# Patient Record
Sex: Male | Born: 1990 | Race: White | Hispanic: No | Marital: Single | Smoking: Never smoker
Health system: Southern US, Community
[De-identification: ages and names within clinical notes are randomized; demographics above are authoritative.]

---

## 2016-05-23 ENCOUNTER — Ambulatory Visit
Admission: EM | Admit: 2016-05-23 | Discharge: 2016-05-23 | Disposition: A | Discharge status: Home / Self Care | Payer: Self-pay | Attending: Internal Medicine | Admitting: Internal Medicine

## 2016-05-23 ENCOUNTER — Encounter: Payer: Self-pay | Admitting: *Deleted

## 2016-05-23 ENCOUNTER — Ambulatory Visit (INDEPENDENT_AMBULATORY_CARE_PROVIDER_SITE_OTHER): Payer: Self-pay

## 2016-05-23 DIAGNOSIS — S93602A Unspecified sprain of left foot, initial encounter: Secondary | ICD-10-CM

## 2016-05-23 MED ORDER — OXYCODONE-ACETAMINOPHEN 5-325 MG PO TABS: 2.0000 | ORAL_TABLET | ORAL | 0 refills | Status: DC | PRN

## 2016-05-23 NOTE — ED Triage Notes (Signed)
Patient injured his left ankle when he stepped down off of a set of steps.

## 2016-05-23 NOTE — ED Provider Notes (Signed)
[facility]    CSN: [id number] Arrival date & time: 05/23/16  1906     History   Chief Complaint Chief Complaint  Patient presents with  . Ankle Pain    HPI Alexander Leon is a 26 y.o. male.   Pt c/o of ankle pain after stepping off a stair and misplacing his foot.  Immediately he was unable to bear weight.        History reviewed. No pertinent past medical history.  There are no active problems to display for this patient.   History reviewed. No pertinent surgical history.     Home Medications    Prior to Admission medications   Medication Sig Start Date End Date Taking? Authorizing Provider  oxyCODONE-acetaminophen (PERCOCET/ROXICET) 5-325 MG tablet Take 2 tablets by mouth every 4 (four) hours as needed for severe pain. 05/23/16   Arnaldo NatalMichael S Daveyon Kitchings, MD    Family History History reviewed. No pertinent family history.  Social History Social History  Substance Use Topics  . Smoking status: Never Smoker  . Smokeless tobacco: Never Used  . Alcohol use Yes     Allergies   Patient has no known allergies.   Review of Systems Review of Systems  Constitutional: Negative for chills and fever.  HENT: Negative for sore throat and tinnitus.   Eyes: Negative for redness.  Respiratory: Negative for cough and shortness of breath.   Cardiovascular: Negative for chest pain and palpitations.  Gastrointestinal: Negative for abdominal pain, diarrhea, nausea and vomiting.  Genitourinary: Negative for dysuria, frequency and urgency.  Musculoskeletal: Positive for joint swelling. Negative for myalgias.  Skin: Negative for rash.       No lesions  Neurological: Negative for weakness.  Hematological: Does not bruise/bleed easily.  Psychiatric/Behavioral: Negative for suicidal ideas.     Physical Exam Triage Vital Signs ED Triage Vitals  Enc Vitals Group     BP 05/23/16 1921 (!) 154/77     Pulse Rate 05/23/16 1921 (!) 113     Resp 05/23/16 1921 16       Temp 05/23/16 1921 98.6 F (37 C)     Temp Source 05/23/16 1921 Oral     SpO2 05/23/16 1921 98 %     Weight 05/23/16 1921 250 lb (113.4 kg)     Height 05/23/16 1921 5\' 10"  (1.778 m)     Head Circumference --      Peak Flow --      Pain Score 05/23/16 1923 9     Pain Loc --      Pain Edu? --      Excl. in GC? --    No data found.   Updated Vital Signs BP (!) 154/77 (BP Location: Left Arm)   Pulse (!) 113   Temp 98.6 F (37 C) (Oral)   Resp 16   Ht 5\' 10"  (1.778 m)   Wt 250 lb (113.4 kg)   SpO2 98%   BMI 35.87 kg/m   Visual Acuity Right Eye Distance:   Left Eye Distance:   Bilateral Distance:    Right Eye Near:   Left Eye Near:    Bilateral Near:     Physical Exam  Constitutional: He is oriented to person, place, and time. He appears well-developed and well-nourished. No distress.  HENT:  Head: Normocephalic and atraumatic.  Mouth/Throat: Oropharynx is clear and moist.  Eyes: Conjunctivae and EOM are normal. Pupils are equal, round, and reactive to light. No scleral icterus.  Neck: Normal  range of motion. Neck supple. No JVD present. No tracheal deviation present. No thyromegaly present.  Cardiovascular: Normal rate, regular rhythm and normal heart sounds.  Exam reveals no gallop and no friction rub.   No murmur heard. Pulmonary/Chest: Effort normal and breath sounds normal. No respiratory distress.  Abdominal: Soft. Bowel sounds are normal. He exhibits no distension. There is no tenderness.  Musculoskeletal: Normal range of motion. He exhibits no edema.       Feet:  No point tenderness of ankle although there appears to be some yellow discoloration (?old bruising) anteriorly  Lymphadenopathy:    He has no cervical adenopathy.  Neurological: He is alert and oriented to person, place, and time. No cranial nerve deficit.  Skin: Skin is warm and dry. No rash noted. No erythema.  Psychiatric: He has a normal mood and affect. His behavior is normal. Judgment and  thought content normal.     UC Treatments / Results  Labs (all labs ordered are listed, but only abnormal results are displayed) Labs Reviewed - No data to display  EKG  EKG Interpretation None       Radiology Dg Ankle Complete Left  Result Date: 05/23/2016 CLINICAL DATA:  26 year old male with left foot and ankle injury and pain. EXAM: LEFT FOOT - COMPLETE 3+ VIEW; LEFT ANKLE COMPLETE - 3+ VIEW COMPARISON:  None. FINDINGS: There is no evidence of fracture or dislocation. There is no evidence of arthropathy or other focal bone abnormality. Soft tissues are unremarkable. IMPRESSION: Negative. Electronically Signed   By: Elgie Collard M.D.   On: 05/23/2016 19:46   Dg Foot Complete Left  Result Date: 05/23/2016 CLINICAL DATA:  26 year old male with left foot and ankle injury and pain. EXAM: LEFT FOOT - COMPLETE 3+ VIEW; LEFT ANKLE COMPLETE - 3+ VIEW COMPARISON:  None. FINDINGS: There is no evidence of fracture or dislocation. There is no evidence of arthropathy or other focal bone abnormality. Soft tissues are unremarkable. IMPRESSION: Negative. Electronically Signed   By: Elgie Collard M.D.   On: 05/23/2016 19:46    Procedures Procedures (including critical care time)  Medications Ordered in UC Medications - No data to display   Initial Impression / Assessment and Plan / UC Course  I have reviewed the triage vital signs and the nursing notes.  Pertinent labs & imaging results that were available during my care of the patient were reviewed by me and considered in my medical decision making (see chart for details).     No fracture.  Likely tendon avulsion/sprain. Supply crutches.  F/u with ortho.  Final Clinical Impressions(s) / UC Diagnoses   Final diagnoses:  Sprain of left foot, initial encounter    New Prescriptions Discharge Medication List as of 05/23/2016  8:00 PM    START taking these medications   Details  oxyCODONE-acetaminophen (PERCOCET/ROXICET) 5-325  MG tablet Take 2 tablets by mouth every 4 (four) hours as needed for severe pain., Starting Wed 05/23/2016, Print         Arnaldo Natal, MD 05/23/16 2013

## 2016-06-26 ENCOUNTER — Encounter: Payer: Self-pay | Admitting: Emergency Medicine

## 2016-06-26 ENCOUNTER — Ambulatory Visit
Admission: EM | Admit: 2016-06-26 | Discharge: 2016-06-26 | Disposition: A | Discharge status: Home / Self Care | Payer: Self-pay | Attending: Family Medicine | Admitting: Family Medicine

## 2016-06-26 DIAGNOSIS — R112 Nausea with vomiting, unspecified: Secondary | ICD-10-CM

## 2016-06-26 DIAGNOSIS — R197 Diarrhea, unspecified: Secondary | ICD-10-CM

## 2016-06-26 MED ORDER — ONDANSETRON 8 MG PO TBDP: 8.0000 mg | ORAL_TABLET | Freq: Two times a day (BID) | ORAL | 0 refills | Status: DC

## 2016-06-26 MED ORDER — ONDANSETRON 8 MG PO TBDP
8.0000 mg | ORAL_TABLET | Freq: Once | ORAL | Status: AC
  Administered 2016-06-26: 8 mg via ORAL

## 2016-06-26 NOTE — ED Triage Notes (Signed)
Patient reports N/V/D that started this morning.

## 2016-06-26 NOTE — ED Provider Notes (Signed)
CSN: [id number]     Arrival date & time 06/26/16  1914 History   First MD Initiated Contact with Patient 06/26/16 1940     Chief Complaint  Patient presents with  . Nausea  . Emesis  . Diarrhea   (Consider location/radiation/quality/duration/timing/severity/associated sxs/prior Treatment) HPI  A 26 year old male who presents with a sudden onset of nausea vomiting and diarrhea that started this morning. Numerous movements that have had no blood or diarrhea. They are extremely watery. His had 2 episodes of vomiting but feels very queasy at the present time. He's had been no chills but has had fever of 99.8 tonight pulse rate of 105. Because of the nausea he has not been able to take in fluids adequately. He states that he has been around children that have had the very similar symptoms that he is now having.       History reviewed. No pertinent past medical history. History reviewed. No pertinent surgical history. History reviewed. No pertinent family history. Social History  Substance Use Topics  . Smoking status: Never Smoker  . Smokeless tobacco: Never Used  . Alcohol use Yes    Review of Systems  Constitutional: Positive for activity change and appetite change. Negative for chills, fatigue and fever.  Gastrointestinal: Positive for diarrhea, nausea and vomiting. Negative for abdominal distention and abdominal pain.  All other systems reviewed and are negative.   Allergies  Patient has no known allergies.  Home Medications   Prior to Admission medications   Medication Sig Start Date End Date Taking? Authorizing Provider  ondansetron (ZOFRAN ODT) 8 MG disintegrating tablet Take 1 tablet (8 mg total) by mouth 2 (two) times daily. 06/26/16   Lutricia Feil, PA-C   Meds Ordered and Administered this Visit   Medications  ondansetron (ZOFRAN-ODT) disintegrating tablet 8 mg (8 mg Oral Given 06/26/16 1952)    BP (!) 147/90 (BP Location: Left Arm)   Pulse (!) 105   Temp  99.8 F (37.7 C) (Oral)   Resp 16   Ht  (1.753 m)   Wt 250 lb (113.4 kg)   SpO2 100%   BMI 36.92 kg/m  No data found.   Physical Exam  Constitutional: He is oriented to person, place, and time. He appears well-developed and well-nourished. No distress.  HENT:  Head: Normocephalic and atraumatic.  Right Ear: External ear normal.  Left Ear: External ear normal.  Nose: Nose normal.  Mouth/Throat: Oropharynx is clear and moist. No oropharyngeal exudate.  Eyes: Pupils are equal, round, and reactive to light. Right eye exhibits no discharge. Left eye exhibits no discharge.  Neck: Normal range of motion.  Pulmonary/Chest: Effort normal and breath sounds normal.  Abdominal: Soft. Bowel sounds are normal. He exhibits no distension and no mass. There is no tenderness. There is no rebound and no guarding.  Musculoskeletal: Normal range of motion.  Neurological: He is alert and oriented to person, place, and time.  Skin: Skin is warm and dry. He is not diaphoretic.  Psychiatric: He has a normal mood and affect. His behavior is normal. Judgment and thought content normal.  Nursing note and vitals reviewed.   Urgent Care Course     Procedures (including critical care time)  Labs Review Labs Reviewed - No data to display  Imaging Review No results found.   Visual Acuity Review  Right Eye Distance:   Left Eye Distance:   Bilateral Distance:    Right Eye Near:   Left Eye Near:  Bilateral Near:     Medications  ondansetron (ZOFRAN-ODT) disintegrating tablet 8 mg (8 mg Oral Given 06/26/16 1952)      MDM   1. Nausea vomiting and diarrhea    Discharge Medication List as of 06/26/2016  7:59 PM    START taking these medications   Details  ondansetron (ZOFRAN ODT) 8 MG disintegrating tablet Take 1 tablet (8 mg total) by mouth 2 (two) times daily., Starting Tue 06/26/2016, Normal      Plan: 1. Test/x-ray results and diagnosis reviewed with patient 2. rx as per  orders; risks, benefits, potential side effects reviewed with patient 3. Recommend supportive treatment with increased fluids. Use Zofran for nausea or vomiting. If you are worsening go to the emergency department or follow-up with her primary care physician 4. F/u prn if symptoms worsen or don't improve     Lutricia Feil, PA-C 06/26/16 2030

## 2017-08-10 ENCOUNTER — Other Ambulatory Visit: Payer: Self-pay

## 2017-08-10 ENCOUNTER — Ambulatory Visit
Admission: EM | Admit: 2017-08-10 | Discharge: 2017-08-10 | Disposition: A | Discharge status: Home / Self Care | Payer: Self-pay | Attending: Orthopedic Surgery | Admitting: Orthopedic Surgery

## 2017-08-10 DIAGNOSIS — M79671 Pain in right foot: Secondary | ICD-10-CM

## 2017-08-10 DIAGNOSIS — M25552 Pain in left hip: Secondary | ICD-10-CM

## 2017-08-10 MED ORDER — MELOXICAM 15 MG PO TABS: 15.0000 mg | ORAL_TABLET | Freq: Every day | ORAL | 0 refills | Status: DC

## 2017-08-10 MED ORDER — METHYLPREDNISOLONE 4 MG PO TBPK: ORAL_TABLET | ORAL | 0 refills | Status: DC

## 2017-08-10 NOTE — Discharge Instructions (Signed)
Please take medications as prescribed.  Try to avoid any excessive weightbearing activity.  Rest ice and elevate the right foot.  Perform stretching exercises as directed for the left hip.  Call orthopedic office Monday morning to schedule follow-up appointment.

## 2017-08-10 NOTE — ED Triage Notes (Signed)
Pt with left hip pain and right foot pain x past 3-4 days. Unsure what happened and denies injury. Pain 9/10. 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

## 2017-08-10 NOTE — ED Provider Notes (Signed)
[facility]    CSN: [id number] Arrival date & time: 08/10/17  0801     History   Chief Complaint Chief Complaint  Patient presents with  . Hip Pain  . Foot Pain    HPI Alexander Leon is a 27 y.o. male.   Presents to the urgent care facility for evaluation of nontraumatic right foot pain for 3 to 4 days of nontraumatic left hip pain for 3 to 4 days.  Patient describes pain along the dorsal aspect of his right foot along the medial portion of the foot extending into and under the medial malleolus.  He is able to ambulate, states he has some pain with ambulation is painful resisted ankle plantarflexion and dorsiflexion.  He denies any warmth redness or fevers.  He has taken occasional ibuprofen with mild relief.  Patient states he is been working excessive amounts over the last few days climbing up and down ladders more than normal.  He is also complained of some left hip pain along the lateral aspect of the hip and slightly into the left side of his groin he describes the pain as a aching pain was tightness.  He denies any trauma or injury to the left hip.  Again, he is ambulatory with no assistive devices.  Pain is moderate, mild relief with ibuprofen.  HPI  History reviewed. No pertinent past medical history.  There are no active problems to display for this patient.   History reviewed. No pertinent surgical history.     Home Medications    Prior to Admission medications   Medication Sig Start Date End Date Taking? Authorizing Provider  meloxicam (MOBIC) 15 MG tablet Take 1 tablet (15 mg total) by mouth daily. 08/10/17   Evon SlackGaines, Wei Newbrough C, PA-C  methylPREDNISolone (MEDROL DOSEPAK) 4 MG TBPK tablet Take Dosepak as directed. 08/10/17   Evon SlackGaines, Neola Worrall C, PA-C  ondansetron (ZOFRAN ODT) 8 MG disintegrating tablet Take 1 tablet (8 mg total) by mouth 2 (two) times daily. 06/26/16   Lutricia Feiloemer, William P, PA-C    Family History Family History  Problem Relation Age of Onset  .  CAD Father     Social History Social History   Tobacco Use  . Smoking status: Never Smoker  . Smokeless tobacco: Never Used  Substance Use Topics  . Alcohol use: Yes    Comment: social  . Drug use: No     Allergies   Patient has no known allergies.   Review of Systems Review of Systems  Constitutional: Negative for fever.  Respiratory: Negative for shortness of breath.   Cardiovascular: Negative for chest pain.  Gastrointestinal: Negative for abdominal pain.  Genitourinary: Negative for difficulty urinating, dysuria and urgency.  Musculoskeletal: Positive for arthralgias. Negative for back pain, gait problem, joint swelling and myalgias.  Skin: Negative for rash.  Neurological: Negative for headaches.     Physical Exam Triage Vital Signs ED Triage Vitals  Enc Vitals Group     BP 08/10/17 0815 (!) 141/100     Pulse Rate 08/10/17 0815 94     Resp 08/10/17 0815 18     Temp 08/10/17 0815 98.6 F (37 C)     Temp src --      SpO2 08/10/17 0815 97 %     Weight 08/10/17 0816 260 lb (117.9 kg)     Height 08/10/17 0816 5\' 10"  (1.778 m)     Head Circumference --      Peak Flow --  Pain Score 08/10/17 0816 9     Pain Loc --      Pain Edu? --      Excl. in GC? --    No data found.  Updated Vital Signs BP (!) 141/100 (BP Location: Left Arm)   Pulse 94   Temp 98.6 F (37 C)   Resp 18   Ht 5\' 10"  (1.778 m)   Wt 260 lb (117.9 kg)   SpO2 97%   BMI 37.31 kg/m   Visual Acuity Right Eye Distance:   Left Eye Distance:   Bilateral Distance:    Right Eye Near:   Left Eye Near:    Bilateral Near:     Physical Exam  Constitutional: He is oriented to person, place, and time. He appears well-developed and well-nourished.  HENT:  Head: Normocephalic and atraumatic.  Eyes: Conjunctivae are normal.  Neck: Normal range of motion.  Cardiovascular: Normal rate.  Pulmonary/Chest: Effort normal. No respiratory distress.  Musculoskeletal: Normal range of motion.    Left hip shows full range of motion with internal and external rotation with no discomfort.  Mildly tender along the anterior hip joint, some pain with resisted hip flexion.  Slightly tender over the left hip trochanter bursa.  Is able to ambulate with no significant limp.  Examination of the right foot shows no swelling warmth erythema.  He is very minimally tender to palpation but has pain with resisted ankle plantarflexion dorsiflexion.  Nontender throughout the ankle.  He is nervous intact in right lower extremity.  Neurological: He is alert and oriented to person, place, and time.  Skin: Skin is warm. No rash noted.  Psychiatric: He has a normal mood and affect. His behavior is normal. Thought content normal.     UC Treatments / Results  Labs (all labs ordered are listed, but only abnormal results are displayed) Labs Reviewed - No data to display  EKG None  Radiology No results found.  Procedures Procedures (including critical care time)  Medications Ordered in UC Medications - No data to display  Initial Impression / Assessment and Plan / UC Course  I have reviewed the triage vital signs and the nursing notes.  Pertinent labs & imaging results that were available during my care of the patient were reviewed by me and considered in my medical decision making (see chart for details).    27 year old male with right foot and left hip pain consistent with tendinitis.  No trauma or injury.  Offered x-rays but educated patient that there was a low probability of any type of fracture arthropathy given his age and no trauma.  Patient is without insurance and he agreed that he would prefer no x-rays at this time.  Will start Medrol Dosepak followed by anti-inflammatory medication.  He will call orthopedic office Monday morning and if no improvement follow-up. Final Clinical Impressions(s) / UC Diagnoses   Final diagnoses:  Right foot pain  Left hip pain     Discharge Instructions      Please take medications as prescribed.  Try to avoid any excessive weightbearing activity.  Rest ice and elevate the right foot.  Perform stretching exercises as directed for the left hip.  Call orthopedic office Monday morning to schedule follow-up appointment.   ED Prescriptions    Medication Sig Dispense Auth. Provider   methylPREDNISolone (MEDROL DOSEPAK) 4 MG TBPK tablet Take Dosepak as directed. 21 tablet Evon Slack, PA-C   meloxicam (MOBIC) 15 MG tablet Take 1 tablet (15  mg total) by mouth daily. 30 tablet Willeen Cass, [place] 08/10/17 [phone]

## 2017-12-11 ENCOUNTER — Ambulatory Visit
Admission: EM | Admit: 2017-12-11 | Discharge: 2017-12-11 | Disposition: A | Discharge status: Home / Self Care | Payer: PRIVATE HEALTH INSURANCE | Attending: Family Medicine | Admitting: Family Medicine

## 2017-12-11 ENCOUNTER — Encounter: Payer: Self-pay | Admitting: Emergency Medicine

## 2017-12-11 ENCOUNTER — Other Ambulatory Visit: Payer: Self-pay

## 2017-12-11 DIAGNOSIS — J069 Acute upper respiratory infection, unspecified: Secondary | ICD-10-CM

## 2017-12-11 DIAGNOSIS — R110 Nausea: Secondary | ICD-10-CM | POA: Diagnosis not present

## 2017-12-11 DIAGNOSIS — B9789 Other viral agents as the cause of diseases classified elsewhere: Secondary | ICD-10-CM | POA: Diagnosis not present

## 2017-12-11 LAB — RAPID STREP SCREEN (MED CTR MEBANE ONLY): STREPTOCOCCUS, GROUP A SCREEN (DIRECT): NEGATIVE

## 2017-12-11 MED ORDER — ONDANSETRON 8 MG PO TBDP: 8.0000 mg | ORAL_TABLET | Freq: Three times a day (TID) | ORAL | 0 refills | Status: DC | PRN

## 2017-12-11 NOTE — ED Provider Notes (Signed)
[facility]    CSN: [id number] Arrival date & time: 12/11/17  0806     History   Chief Complaint Chief Complaint  Patient presents with  . Nasal Congestion  . Sore Throat    HPI Alexander Leon is a 27 y.o. male.   The history is provided by the patient.  Sore Throat   URI  Presenting symptoms: congestion, cough, rhinorrhea and sore throat   Severity:  Moderate Onset quality:  Sudden Duration:  3 days Timing:  Constant Progression:  Worsening Chronicity:  New Relieved by:  Nothing Ineffective treatments:  OTC medications Associated symptoms: no sinus pain and no wheezing   Associated symptoms comment:  Nausea Risk factors: sick contacts   Risk factors: not elderly, no chronic cardiac disease, no chronic kidney disease, no chronic respiratory disease, no diabetes mellitus, no immunosuppression, no recent illness and no recent travel     History reviewed. No pertinent past medical history.  There are no active problems to display for this patient.   History reviewed. No pertinent surgical history.     Home Medications    Prior to Admission medications   Medication Sig Start Date End Date Taking? Authorizing Provider  meloxicam (MOBIC) 15 MG tablet Take 1 tablet (15 mg total) by mouth daily. 08/10/17   Evon Slack, PA-C  methylPREDNISolone (MEDROL DOSEPAK) 4 MG TBPK tablet Take Dosepak as directed. 08/10/17   Evon Slack, PA-C  ondansetron (ZOFRAN ODT) 8 MG disintegrating tablet Take 1 tablet (8 mg total) by mouth every 8 (eight) hours as needed. 12/11/17   Payton Mccallum, MD    Family History Family History  Problem Relation Age of Onset  . Diabetes Mother   . CAD Father     Social History Social History   Tobacco Use  . Smoking status: Never Smoker  . Smokeless tobacco: Never Used  Substance Use Topics  . Alcohol use: Yes    Comment: social  . Drug use: No     Allergies   Patient has no known allergies.   Review of  Systems Review of Systems  HENT: Positive for congestion, rhinorrhea and sore throat. Negative for sinus pain.   Respiratory: Positive for cough. Negative for wheezing.      Physical Exam Triage Vital Signs ED Triage Vitals  Enc Vitals Group     BP 12/11/17 0818 139/85     Pulse Rate 12/11/17 0818 82     Resp --      Temp 12/11/17 0818 98.7 F (37.1 C)     Temp Source 12/11/17 0818 Oral     SpO2 12/11/17 0818 98 %     Weight 12/11/17 0817 260 lb (117.9 kg)     Height 12/11/17 0817 5\' 10"  (1.778 m)     Head Circumference --      Peak Flow --      Pain Score 12/11/17 0817 0     Pain Loc --      Pain Edu? --      Excl. in GC? --    No data found.  Updated Vital Signs BP 139/85 (BP Location: Left Arm)   Pulse 82   Temp 98.7 F (37.1 C) (Oral)   Ht 5\' 10"  (1.778 m)   Wt 117.9 kg   SpO2 98%   BMI 37.31 kg/m   Visual Acuity Right Eye Distance:   Left Eye Distance:   Bilateral Distance:    Right Eye Near:   Left  Eye Near:    Bilateral Near:     Physical Exam  Constitutional: He appears well-developed and well-nourished. No distress.  HENT:  Head: Normocephalic and atraumatic.  Right Ear: Tympanic membrane, external ear and ear canal normal.  Left Ear: Tympanic membrane, external ear and ear canal normal.  Nose: Mucosal edema and rhinorrhea present.  Mouth/Throat: Uvula is midline and mucous membranes are normal. Posterior oropharyngeal erythema present. No oropharyngeal exudate, posterior oropharyngeal edema or tonsillar abscesses. No tonsillar exudate.  Eyes: Right eye exhibits no discharge. Left eye exhibits no discharge. No scleral icterus.  Neck: Normal range of motion. Neck supple. No tracheal deviation present. No thyromegaly present.  Cardiovascular: Normal rate, regular rhythm and normal heart sounds.  Pulmonary/Chest: Effort normal and breath sounds normal. No stridor. No respiratory distress. He has no wheezes. He has no rales. He exhibits no tenderness.    Abdominal: Soft. Bowel sounds are normal. He exhibits no distension. There is no tenderness.  Lymphadenopathy:    He has no cervical adenopathy.  Neurological: He is alert.  Skin: Skin is warm and dry. No rash noted. He is not diaphoretic.  Nursing note and vitals reviewed.    UC Treatments / Results  Labs (all labs ordered are listed, but only abnormal results are displayed) Labs Reviewed  RAPID STREP SCREEN ([facility] ONLY)  CULTURE, GROUP A STREP [facility])    EKG None  Radiology No results found.  Procedures Procedures (including critical care time)  Medications Ordered in UC Medications - No data to display  Initial Impression / Assessment and Plan / UC Course  I have reviewed the triage vital signs and the nursing notes.  Pertinent labs & imaging results that were available during my care of the patient were reviewed by me and considered in my medical decision making (see chart for details).      Final Clinical Impressions(s) / UC Diagnoses   Final diagnoses:  Viral URI with cough  Nausea without vomiting    ED Prescriptions    Medication Sig Dispense Auth. Provider   ondansetron (ZOFRAN ODT) 8 MG disintegrating tablet Take 1 tablet (8 mg total) by mouth every 8 (eight) hours as needed. 6 tablet Payton Mccallum, MD      1. Lab results and diagnosis reviewed with patient 2. rx as per orders above; reviewed possible side effects, interactions, risks and benefits  3. Recommend supportive treatment with rest, fluids, otc analgesics prn 4. Follow-up prn if symptoms worsen or don't improve   Controlled Substance Prescriptions [place] Controlled Substance Registry consulted? Not Applicable   Payton Mccallum, MD 12/11/17 [phone]

## 2017-12-11 NOTE — ED Triage Notes (Signed)
Patient in today c/o nasal congestion and sore throat x 2-3 days. Patient denies fever. Patient has tried OTC Nyquil.

## 2017-12-13 LAB — CULTURE, GROUP A STREP (THRC)

## 2018-03-19 IMAGING — CR DG ANKLE COMPLETE 3+V*L*
3 series · 3 of 3 positions shown · non-contrast
Comparison: None.

CLINICAL DATA: 26-year-old male with left foot and ankle injury and
pain.

EXAM:
LEFT FOOT - COMPLETE 3+ VIEW; LEFT ANKLE COMPLETE - 3+ VIEW

[ankle ap]
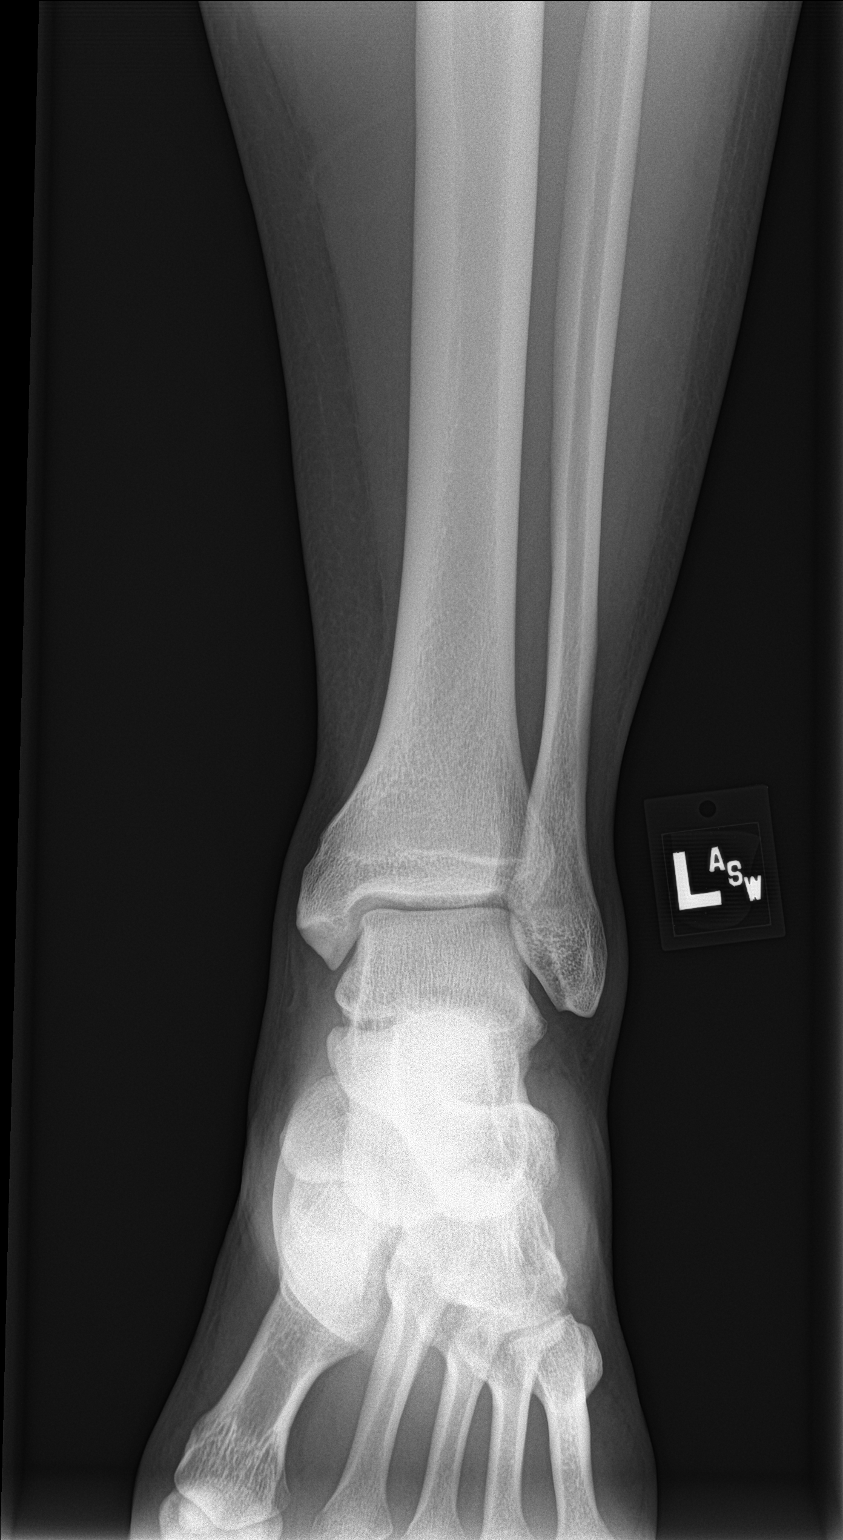

[ankle obl]
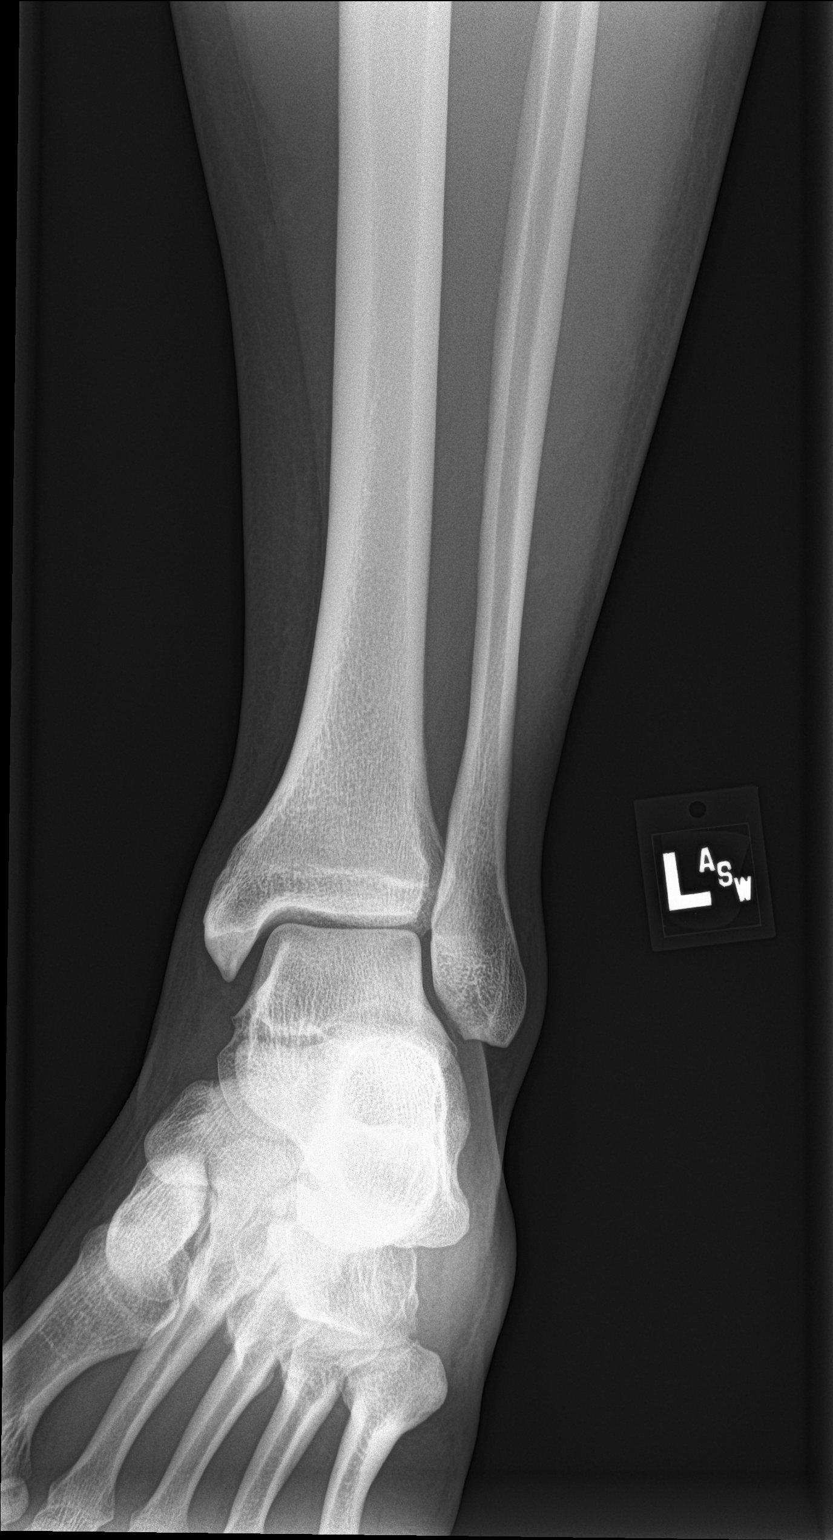

[ankle lat]
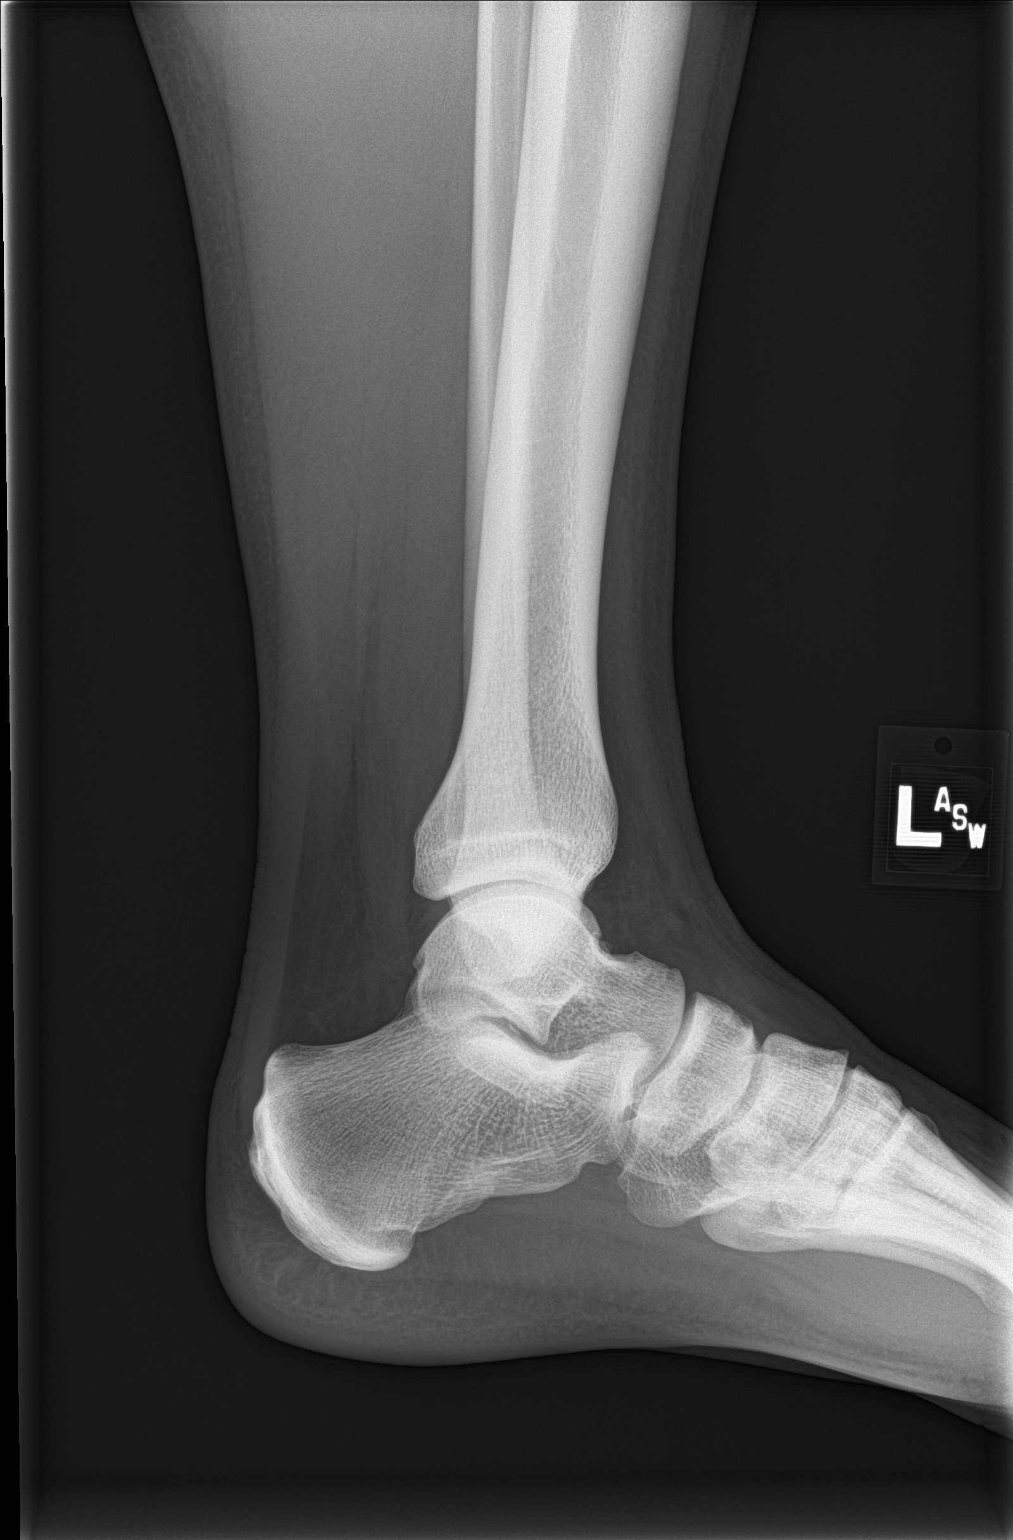

[3 of 3 positions shown; findings below may reference images not displayed]

FINDINGS: There is no evidence of fracture or dislocation. There is no
evidence of arthropathy or other focal bone abnormality. Soft
tissues are unremarkable.
IMPRESSION: Negative.

## 2020-12-27 ENCOUNTER — Ambulatory Visit
Admission: EM | Admit: 2020-12-27 | Discharge: 2020-12-27 | Disposition: A | Discharge status: Home / Self Care | Payer: Self-pay | Attending: Emergency Medicine | Admitting: Emergency Medicine

## 2020-12-27 ENCOUNTER — Ambulatory Visit (INDEPENDENT_AMBULATORY_CARE_PROVIDER_SITE_OTHER): Payer: Self-pay

## 2020-12-27 ENCOUNTER — Other Ambulatory Visit: Payer: Self-pay

## 2020-12-27 DIAGNOSIS — M79671 Pain in right foot: Secondary | ICD-10-CM

## 2020-12-27 DIAGNOSIS — M722 Plantar fascial fibromatosis: Secondary | ICD-10-CM

## 2020-12-27 MED ORDER — PREDNISONE 10 MG (21) PO TBPK: ORAL_TABLET | ORAL | 0 refills | Status: AC

## 2020-12-27 NOTE — Discharge Instructions (Addendum)
Start the prednisone pack according to the package instructions this morning we will get it filled.  He will take it each morning going forward at breakfast time.  Always take it with food.  Wear supportive footwear that has good arch support to take the stress off of your plantar fascia.  In the morning in the evening ice your arch using a frozen water bottle to decrease inflammation and improve pain.  Follow-up with podiatry.  I would recommend contacting [facility] since they have a significant charity care program and they are charger is to take care of the patients of [place].

## 2020-12-27 NOTE — ED Triage Notes (Signed)
Pt c/o chronic right foot pain. Pt was advised it may have been gout and has adjusted his diet to try and help this. Pt states he is still having flares of pain and swelling to the foot. Pt has never been on medication for gout or had labs drawn to establish whether or not it is definitively gout. Pt is concerned that the flares are becoming more frequent. Pt does not have a PCP.

## 2020-12-27 NOTE — ED Provider Notes (Signed)
[facility]    CSN: [id number] Arrival date & time: 12/27/20  0849      History   Chief Complaint Chief Complaint  Patient presents with   Foot Pain    right    HPI Alexander Leon is a 30 y.o. male.   HPI  30 year old male here for evaluation of right foot pain.  Patient reports that he has been experiencing right foot pain for several years since suffering a previous injury.  According to the records he had a foot sprain in March 2018 and was evaluated in this clinic.  He states that since then he has been having intermittent pain that will start in his great toe and extend back to his ankle and cause swelling to his midfoot.  Now he is experiencing pain in his arch that makes it painful to walk and he has been compensating by walking on the lateral edge of his foot is now experiencing pain in the last 2 toes.  The pain is worse with weightbearing and better at rest.  He states that he thought it might be gout flares, after reading up on gout himself, and modified his diet and took anti-inflammatories without any great improvement of symptoms.  Patient was initially referred to orthopedics following his injury in 2018 but he never followed up secondary to not having insurance.  Patient denies any numbness or tingling in his toes and he states that his foot never becomes hot and red when it is painful and swollen.  History reviewed. No pertinent past medical history.  There are no problems to display for this patient.   History reviewed. No pertinent surgical history.     Home Medications    Prior to Admission medications   Medication Sig Start Date End Date Taking? Authorizing Provider  predniSONE (STERAPRED UNI-PAK 21 TAB) 10 MG (21) TBPK tablet Take 6 tablets on day 1, 5 tablets day 2, 4 tablets day 3, 3 tablets day 4, 2 tablets day 5, 1 tablet day 6 12/27/20  Yes Becky Augusta, NP    Family History Family History  Problem Relation Age of Onset    Diabetes Mother    CAD Father     Social History Social History   Tobacco Use   Smoking status: Never   Smokeless tobacco: Never  Vaping Use   Vaping Use: Never used  Substance Use Topics   Alcohol use: Yes    Comment: social   Drug use: No     Allergies   Patient has no known allergies.   Review of Systems Review of Systems  Constitutional:  Negative for activity change, appetite change and fever.  Musculoskeletal:  Positive for arthralgias and joint swelling.  Skin:  Negative for color change.  Neurological:  Negative for weakness and numbness.  Hematological: Negative.   Psychiatric/Behavioral: Negative.      Physical Exam Triage Vital Signs ED Triage Vitals  Enc Vitals Group     BP 12/27/20 0934 (!) 153/102     Pulse Rate 12/27/20 0934 82     Resp 12/27/20 0934 18     Temp 12/27/20 0934 98.7 F (37.1 C)     Temp Source 12/27/20 0934 Oral     SpO2 12/27/20 0934 97 %     Weight 12/27/20 0932 260 lb (117.9 kg)     Height 12/27/20 0932 5\' 9"  (1.753 m)     Head Circumference --      Peak Flow --  Pain Score 12/27/20 0931 8     Pain Loc --      Pain Edu? --      Excl. in GC? --    No data found.  Updated Vital Signs BP (!) 153/102 (BP Location: Left Arm)   Pulse 82   Temp 98.7 F (37.1 C) (Oral)   Resp 18   Ht 5\' 9"  (1.753 m)   Wt 260 lb (117.9 kg)   SpO2 97%   BMI 38.40 kg/m   Visual Acuity Right Eye Distance:   Left Eye Distance:   Bilateral Distance:    Right Eye Near:   Left Eye Near:    Bilateral Near:     Physical Exam Vitals and nursing note reviewed.  Constitutional:      Appearance: Normal appearance. He is obese.  Musculoskeletal:        General: Tenderness present. No swelling or deformity. Normal range of motion.  Skin:    General: Skin is warm and dry.     Capillary Refill: Capillary refill takes less than 2 seconds.     Findings: No bruising, erythema, lesion or rash.  Neurological:     General: No focal deficit  present.     Mental Status: He is alert and oriented to person, place, and time.     Sensory: No sensory deficit.     Motor: No weakness.  Psychiatric:        Mood and Affect: Mood normal.        Behavior: Behavior normal.        Thought Content: Thought content normal.        Judgment: Judgment normal.     UC Treatments / Results  Labs (all labs ordered are listed, but only abnormal results are displayed) Labs Reviewed - No data to display  EKG   Radiology DG Foot Complete Right  Result Date: 12/27/2020 CLINICAL DATA:  30 year old male with arch and distal foot pain. No recent injury. EXAM: RIGHT FOOT COMPLETE - 3+ VIEW COMPARISON:  None. FINDINGS: Bone mineralization is within normal limits. No acute osseous abnormality identified. No discrete soft tissue abnormality. Mild 1st MTP joint space loss and subchondral sclerosis. Subtle similar findings at the 1st IP joint. Other joint spaces and alignment appear normal. But there is chronic degenerative spurring along the dorsal tarsal bones. IMPRESSION: No acute osseous abnormality identified. 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 Electronically Signed   By: 32 M.D.   On: 12/27/2020 10:32    Procedures Procedures (including critical care time)  Medications Ordered in UC Medications - No data to display  Initial Impression / Assessment and Plan / UC Course  I have reviewed the triage vital signs and the nursing notes.  Pertinent labs & imaging results that were available during my care of the patient were reviewed by me and considered in my medical decision making (see chart for details).  Patient is a nontoxic-appearing 30 year old male here for evaluation of right foot pain as outlined in the HPI above.  Physical exam reveals a right foot that is in normal anatomical alignment.  There is no erythema or ecchymosis at present and patient has full range of motion of his foot, ankle, and toes.  DP and PT  pulses are 2+.  Patient does express tenderness with palpation of the third and fourth M TP joints but not with palpation in any of the other joints.  This tenderness does extend up the  metatarsal and along the lateral edge of the foot.  There is no tenderness to the anterior talus joint or with palpation of the medial lateral malleolus.  No Achilles tenderness or calcaneal tenderness noted on exam.  Patient does have tenderness with palpation of the plantar fascia in the arch that extends from midline of the talus to the ball of the foot.  Patient believes that he has been experiencing gout for last 4 years since he suffered an initial injury but states that the area has never become hot and red.  They have also not responded to diet modifications and anti-inflammatories.  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  He did not follow-up as was advised with orthopedics for proper reevaluation and rehabilitation secondary to not having insurance.  I believe the patient has now developed Planter fasciitis as a result of the recurrent swelling he experiences in his foot and his gait abnormality secondary to the pain.  I believe the pain in his lateral 2 toes is also secondary to his current gait modification and walking on the lateral edge of his foot.  We will obtain x-ray of the right foot to look for any evidence of stress fracture or previous injury.  Also look for arthritis.  Plan is to refer patient to podiatry at [facility] as he does not have insurance for further management and rehabilitation.  Right foot radiographs independently reviewed and evaluated by me.  Impression: There is no evidence of acute fracture or dislocation.  In the oblique view there is some questionable irregularity to the proximal and medial aspect of the medial cuneiform.  This is also visible on the lateral.  Radiology overread is pending. Radiology impression is no acute osseous  abnormality identified.  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  We will treat patient with steroid pack and home physical therapy.  I will also refer him to podiatry.   Final Clinical Impressions(s) / UC Diagnoses   Final diagnoses:  Plantar fasciitis of right foot     Discharge Instructions      Start the prednisone pack according to the package instructions this morning we will get it filled.  He will take it each morning going forward at breakfast time.  Always take it with food.  Wear supportive footwear that has good arch support to take the stress off of your plantar fascia.  In the morning in the evening ice your arch using a frozen water bottle to decrease inflammation and improve pain.  Follow-up with podiatry.  I would recommend contacting [facility] since they have a significant charity care program and they are charger is to take care of the patients of [place].     ED Prescriptions     Medication Sig Dispense Auth. Provider   predniSONE (STERAPRED UNI-PAK 21 TAB) 10 MG (21) TBPK tablet Take 6 tablets on day 1, 5 tablets day 2, 4 tablets day 3, 3 tablets day 4, 2 tablets day 5, 1 tablet day 6 21 tablet Becky Augusta, NP      PDMP not reviewed this encounter.   Becky Augusta, NP 12/27/20 1042

## 2021-03-17 ENCOUNTER — Ambulatory Visit
Admission: EM | Admit: 2021-03-17 | Discharge: 2021-03-17 | Disposition: A | Discharge status: Home / Self Care | Payer: BC Managed Care – PPO | Attending: Physician Assistant | Admitting: Physician Assistant

## 2021-03-17 ENCOUNTER — Other Ambulatory Visit: Payer: Self-pay

## 2021-03-17 ENCOUNTER — Encounter: Payer: Self-pay | Admitting: Emergency Medicine

## 2021-03-17 DIAGNOSIS — R051 Acute cough: Secondary | ICD-10-CM | POA: Diagnosis present

## 2021-03-17 DIAGNOSIS — J069 Acute upper respiratory infection, unspecified: Secondary | ICD-10-CM | POA: Insufficient documentation

## 2021-03-17 DIAGNOSIS — Z20822 Contact with and (suspected) exposure to covid-19: Secondary | ICD-10-CM | POA: Diagnosis not present

## 2021-03-17 DIAGNOSIS — R0981 Nasal congestion: Secondary | ICD-10-CM | POA: Diagnosis present

## 2021-03-17 LAB — RESP PANEL BY RT-PCR (FLU A&B, COVID) ARPGX2
Influenza A by PCR: NEGATIVE
Influenza B by PCR: NEGATIVE
SARS Coronavirus 2 by RT PCR: NEGATIVE

## 2021-03-17 MED ORDER — BENZONATATE 100 MG PO CAPS: 100.0000 mg | ORAL_CAPSULE | Freq: Three times a day (TID) | ORAL | 0 refills | Status: AC

## 2021-03-17 MED ORDER — FLUTICASONE PROPIONATE 50 MCG/ACT NA SUSP: 2.0000 | Freq: Every day | NASAL | 0 refills | Status: AC

## 2021-03-17 NOTE — ED Triage Notes (Signed)
Patient c/o cough, chest congestion and nasal congestion that started on Wed.  Patient states he took a rapid covid test on Wed and was negative.  Patient has not checked his temperature.

## 2021-03-17 NOTE — ED Provider Notes (Signed)
[facility]    CSN: [id number] Arrival date & time: 03/17/21  0805      History   Chief Complaint Chief Complaint  Patient presents with   Cough   Nasal Congestion    HPI Alexander Leon is a 31 y.o. male.   HPI  Cold Symptoms: Patient reports that they have had symptoms of dry cough, nasal congestion, chest congestion for the past 2-3 days. Symptoms are stable. They deny SOB, chest pain, fever or vomiting. They have tried nothing yet for symptoms. No known sick contacts. They also do report taking a home COVID-19 test which was negative.   History reviewed. No pertinent past medical history.  There are no problems to display for this patient.   History reviewed. No pertinent surgical history.   Home Medications    Prior to Admission medications   Medication Sig Start Date End Date Taking? Authorizing Provider  predniSONE (STERAPRED UNI-PAK 21 TAB) 10 MG (21) TBPK tablet Take 6 tablets on day 1, 5 tablets day 2, 4 tablets day 3, 3 tablets day 4, 2 tablets day 5, 1 tablet day 6 12/27/20   Becky Augusta, NP    Family History Family History  Problem Relation Age of Onset   Diabetes Mother    CAD Father     Social History Social History   Tobacco Use   Smoking status: Never   Smokeless tobacco: Never  Vaping Use   Vaping Use: Never used  Substance Use Topics   Alcohol use: Yes    Comment: social   Drug use: No     Allergies   Patient has no known allergies.   Review of Systems Review of Systems  As stated above in HPI Physical Exam Triage Vital Signs ED Triage Vitals  Enc Vitals Group     BP 03/17/21 0821 (!) 150/104     Pulse Rate 03/17/21 0821 85     Resp 03/17/21 0821 15     Temp 03/17/21 0821 98.3 F (36.8 C)     Temp Source 03/17/21 0821 Oral     SpO2 03/17/21 0821 97 %     Weight 03/17/21 0818 250 lb (113.4 kg)     Height 03/17/21 0818 5\' 9"  (1.753 m)     Head Circumference --      Peak Flow --      Pain Score 03/17/21  0818 0     Pain Loc --      Pain Edu? --      Excl. in GC? --    No data found.  Updated Vital Signs BP (!) 150/104 (BP Location: Left Arm)    Pulse 85    Temp 98.3 F (36.8 C) (Oral)    Resp 15    Ht 5\' 9"  (1.753 m)    Wt 250 lb (113.4 kg)    SpO2 97%    BMI 36.92 kg/m   Physical Exam Vitals and nursing note reviewed.  Constitutional:      General: He is not in acute distress.    Appearance: He is well-developed.  HENT:     Head: Normocephalic and atraumatic.     Right Ear: Tympanic membrane normal.     Left Ear: Tympanic membrane normal.     Nose: Congestion and rhinorrhea present.     Mouth/Throat:     Mouth: Mucous membranes are moist.     Pharynx: Oropharynx is clear. No oropharyngeal exudate or posterior oropharyngeal erythema.  Eyes:  Conjunctiva/sclera: Conjunctivae normal.  Cardiovascular:     Rate and Rhythm: Regular rhythm. Tachycardia present.     Heart sounds: No murmur heard. Pulmonary:     Effort: Pulmonary effort is normal. No respiratory distress.     Breath sounds: Normal breath sounds.  Abdominal:     Palpations: Abdomen is soft.     Tenderness: There is no abdominal tenderness.  Musculoskeletal:        General: No swelling.     Cervical back: Neck supple.  Lymphadenopathy:     Cervical: No cervical adenopathy.  Skin:    General: Skin is warm and dry.     Capillary Refill: Capillary refill takes less than 2 seconds.  Neurological:     Mental Status: He is alert.  Psychiatric:        Mood and Affect: Mood normal.     UC Treatments / Results  Labs (all labs ordered are listed, but only abnormal results are displayed) Labs Reviewed  RESP PANEL BY RT-PCR (FLU A&B, COVID) [id number]    EKG   Radiology No results found.  Procedures Procedures (including critical care time)  Medications Ordered in UC Medications - No data to display  Initial Impression / Assessment and Plan / UC Course  I have reviewed the triage vital signs and the  nursing notes.  Pertinent labs & imaging results that were available during my care of the patient were reviewed by me and considered in my medical decision making (see chart for details).     New. Appears viral in nature. Testing for influenza, COVID-19 pending. For now rest, hydration with water, tessalon and flonase. Discussed red flag signs and symptoms. Follow up PRN.    Final Clinical Impressions(s) / UC Diagnoses   Final diagnoses:  None   Discharge Instructions   None    ED Prescriptions   None    PDMP not reviewed this encounter.   Rushie Chestnut, [place] 03/17/21 [phone]

## 2022-10-23 IMAGING — CR DG FOOT COMPLETE 3+V*R*
3 series · 3 of 3 positions shown · non-contrast
Comparison: None.

CLINICAL DATA: 30-year-old male with arch and distal foot pain. No
recent injury.

EXAM:
RIGHT FOOT COMPLETE - 3+ VIEW

[foot ap]
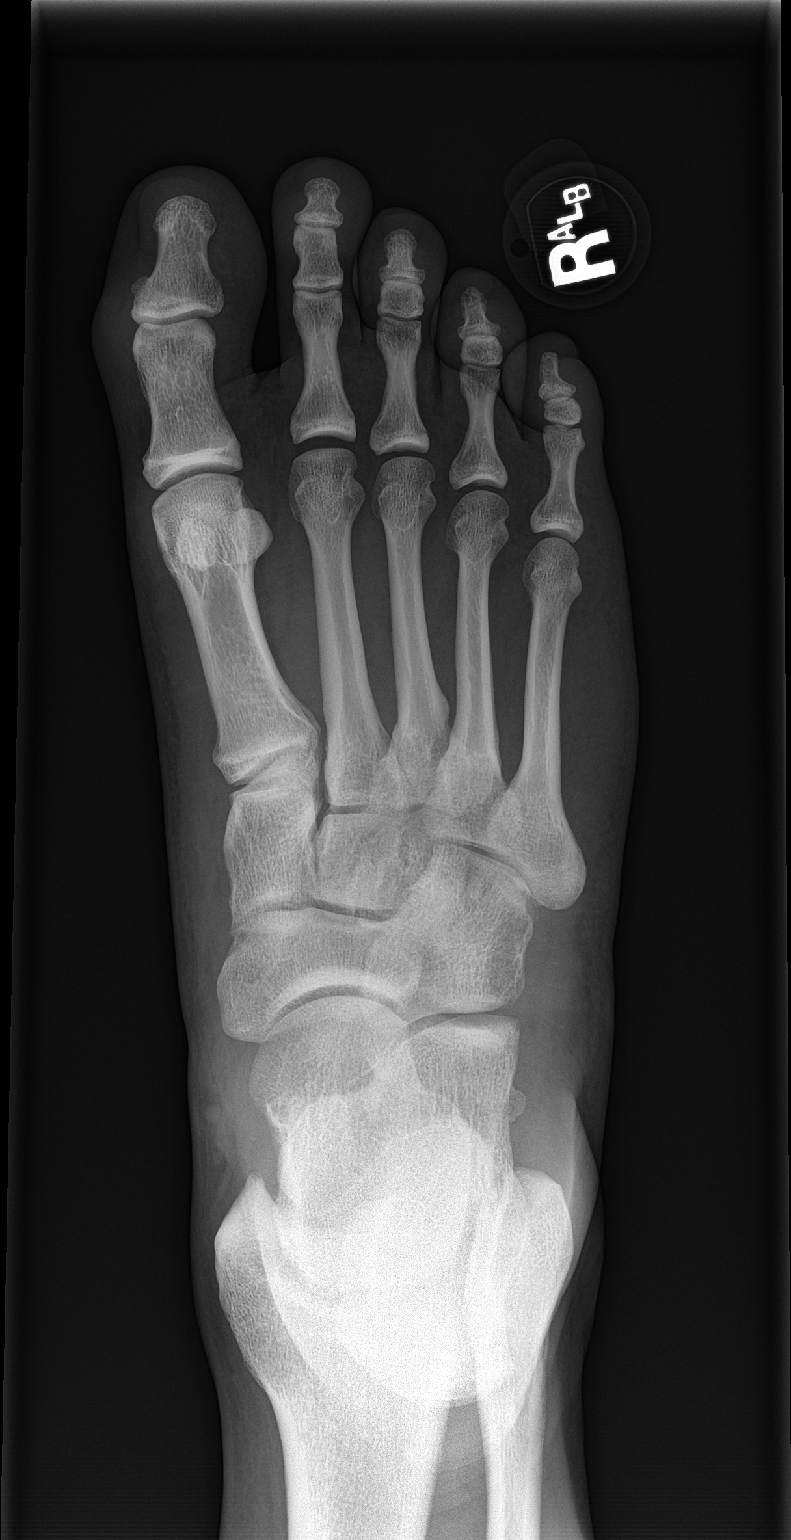

[foot obl]
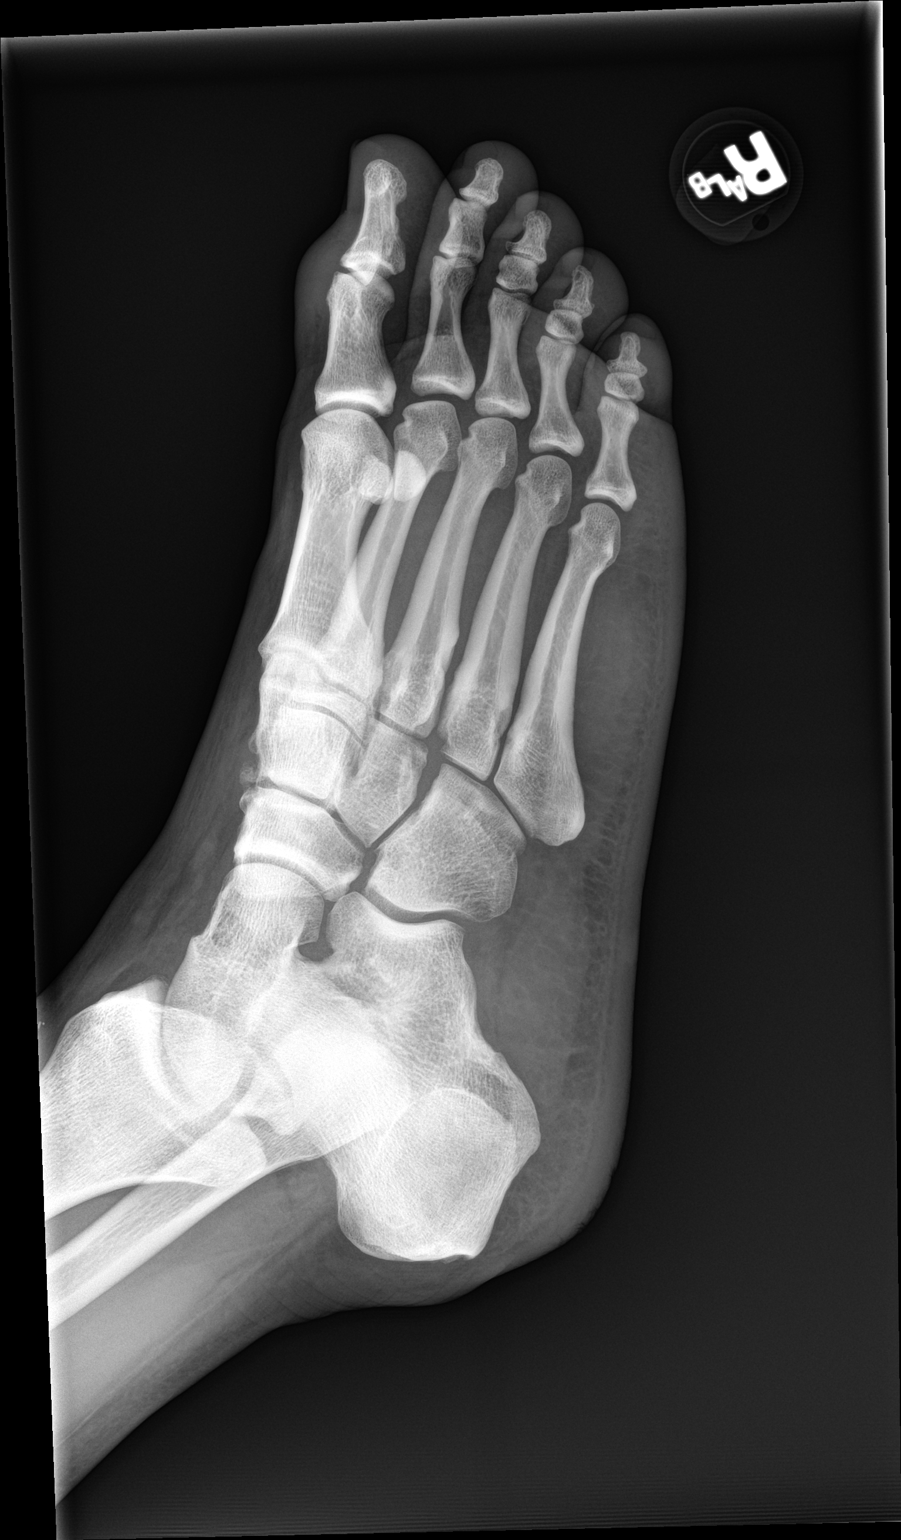

[foot lat]
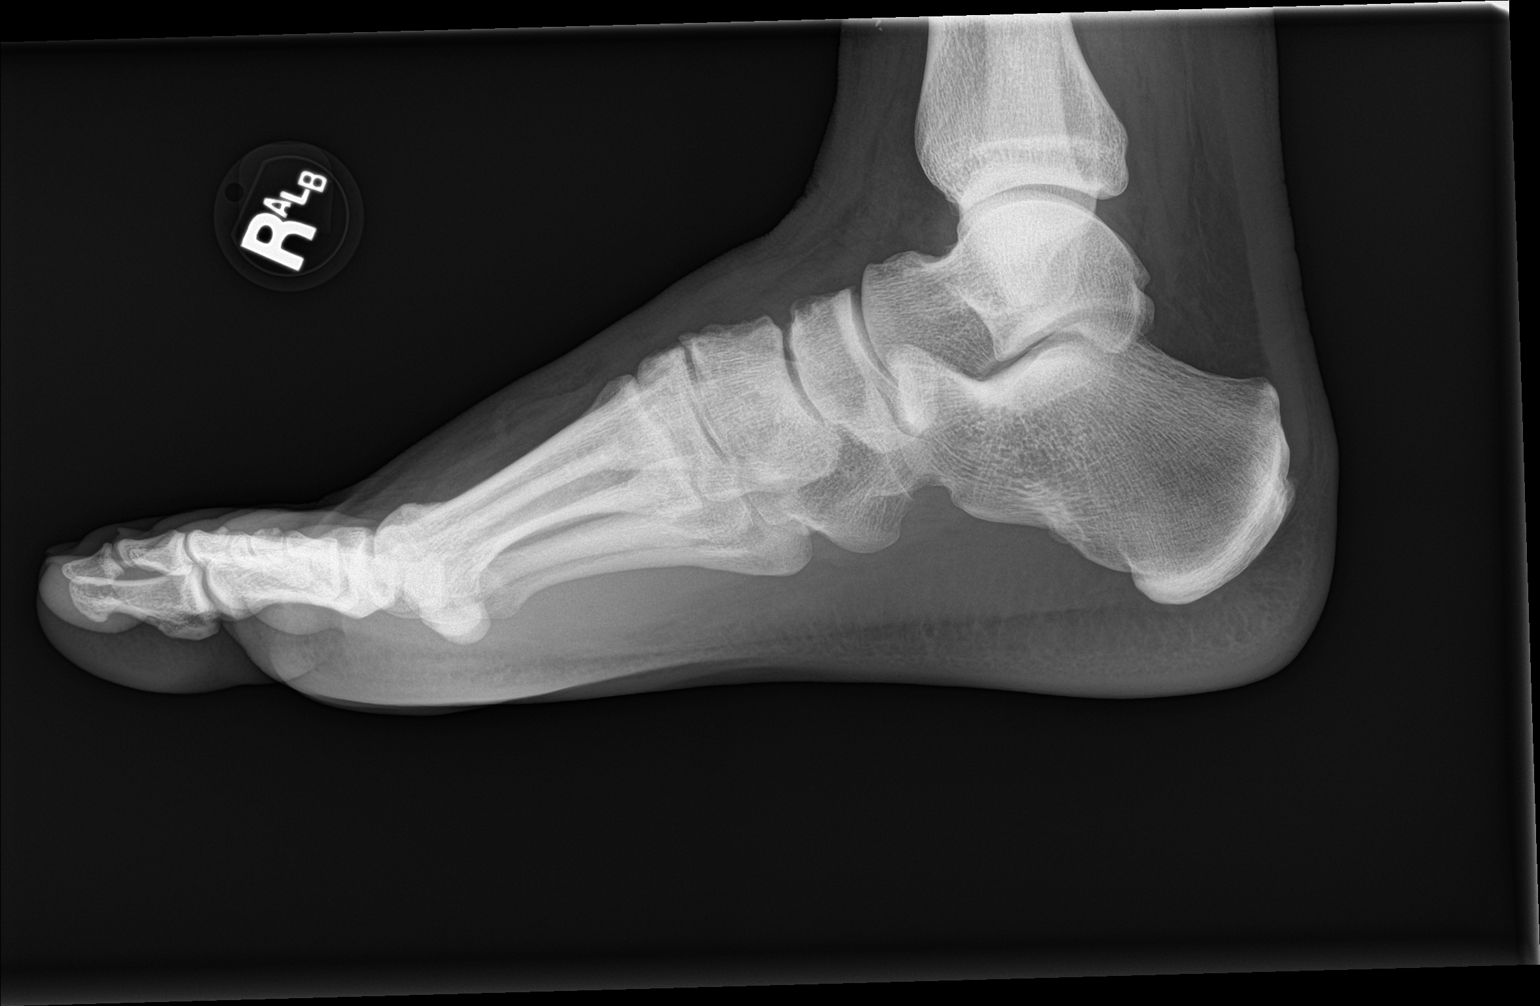

[3 of 3 positions shown; findings below may reference images not displayed]

FINDINGS: Bone mineralization is within normal limits. No acute osseous
abnormality identified. No discrete soft tissue abnormality. Mild
1st MTP joint space loss and subchondral sclerosis. Subtle similar
findings at the 1st IP joint. Other joint spaces and alignment
appear normal. But there is chronic degenerative spurring along the
dorsal tarsal bones.
IMPRESSION: No acute osseous abnormality identified. Mild osteoarthritis in the
1st ray, at the dorsal tarsal bones.
